# Patient Record
Sex: Female | Born: 1965 | Race: White | Hispanic: No | Marital: Married | Smoking: Never smoker
Health system: Southern US, Community
[De-identification: ages and names within clinical notes are randomized; demographics above are authoritative.]

## PROBLEM LIST (undated history)

## (undated) DIAGNOSIS — M1990 Unspecified osteoarthritis, unspecified site: Secondary | ICD-10-CM

---

## 1997-12-10 ENCOUNTER — Other Ambulatory Visit: Admission: RE | Admit: 1997-12-10 | Discharge: 1997-12-10 | Payer: Self-pay | Admitting: Obstetrics and Gynecology

## 2000-05-30 ENCOUNTER — Encounter: Payer: Self-pay | Admitting: Family Medicine

## 2000-05-30 ENCOUNTER — Ambulatory Visit (HOSPITAL_COMMUNITY): Admission: RE | Admit: 2000-05-30 | Discharge: 2000-05-30 | Payer: Self-pay | Admitting: Family Medicine

## 2001-08-20 ENCOUNTER — Ambulatory Visit (HOSPITAL_COMMUNITY): Admission: RE | Admit: 2001-08-20 | Discharge: 2001-08-20 | Payer: Self-pay | Admitting: Family Medicine

## 2001-08-20 ENCOUNTER — Encounter: Payer: Self-pay | Admitting: Family Medicine

## 2004-05-28 ENCOUNTER — Emergency Department (HOSPITAL_COMMUNITY): Admission: EM | Admit: 2004-05-28 | Discharge: 2004-05-28 | Payer: Self-pay | Admitting: Emergency Medicine

## 2004-05-29 ENCOUNTER — Emergency Department (HOSPITAL_COMMUNITY): Admission: EM | Admit: 2004-05-29 | Discharge: 2004-05-29 | Payer: Self-pay | Admitting: Emergency Medicine

## 2004-06-04 ENCOUNTER — Ambulatory Visit (HOSPITAL_COMMUNITY): Admission: RE | Admit: 2004-06-04 | Discharge: 2004-06-04 | Payer: Self-pay | Admitting: Internal Medicine

## 2005-08-24 IMAGING — CR DG RIBS 2V*R*
2 series · 2 of 2 positions shown · non-contrast
Comparison: none

CLINICAL DATA: Fell [REDACTED] against concrete.  Pain.  
 RIGHT RIB VIEWS:
 There are displaced fractures of the lateral right sixth and seventh ribs.  There is an associated 5 to 10 percent right apical pneumothorax.  There are right basilar atelectatic changes.

[view not recorded (1 of 2)]
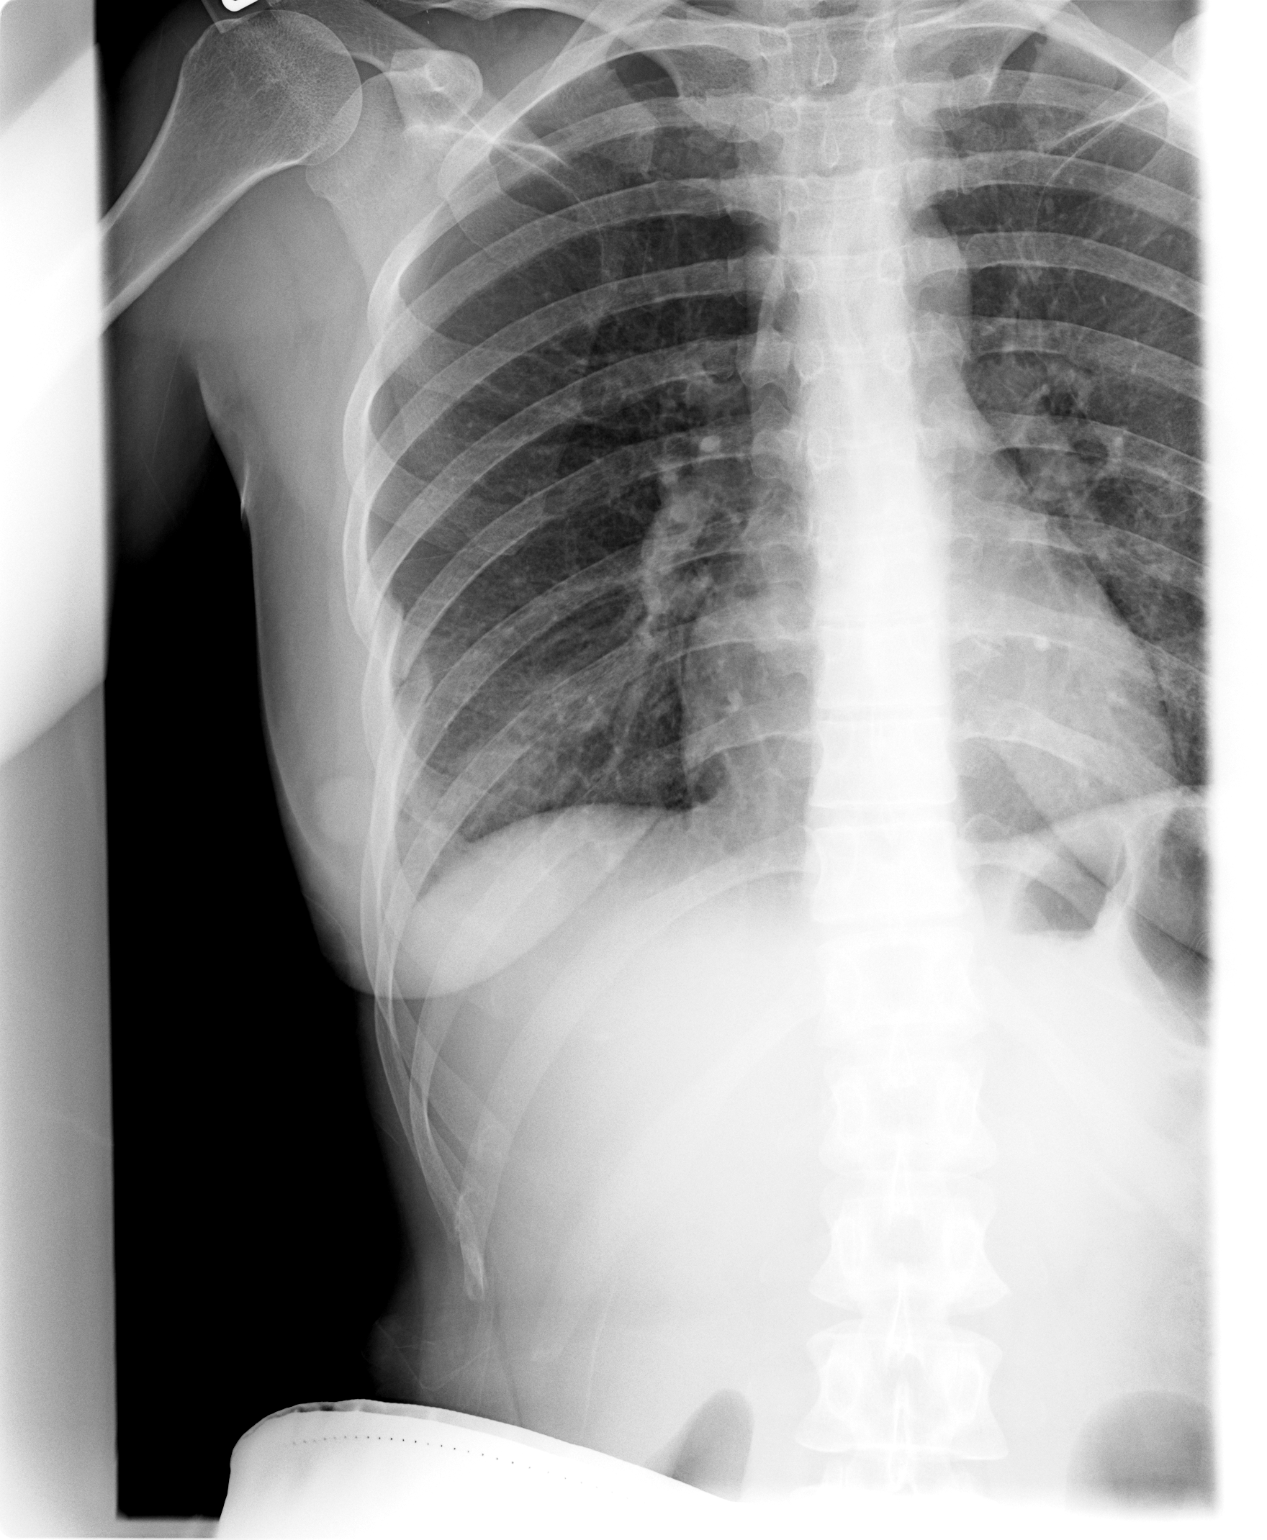

[view not recorded (2 of 2)]
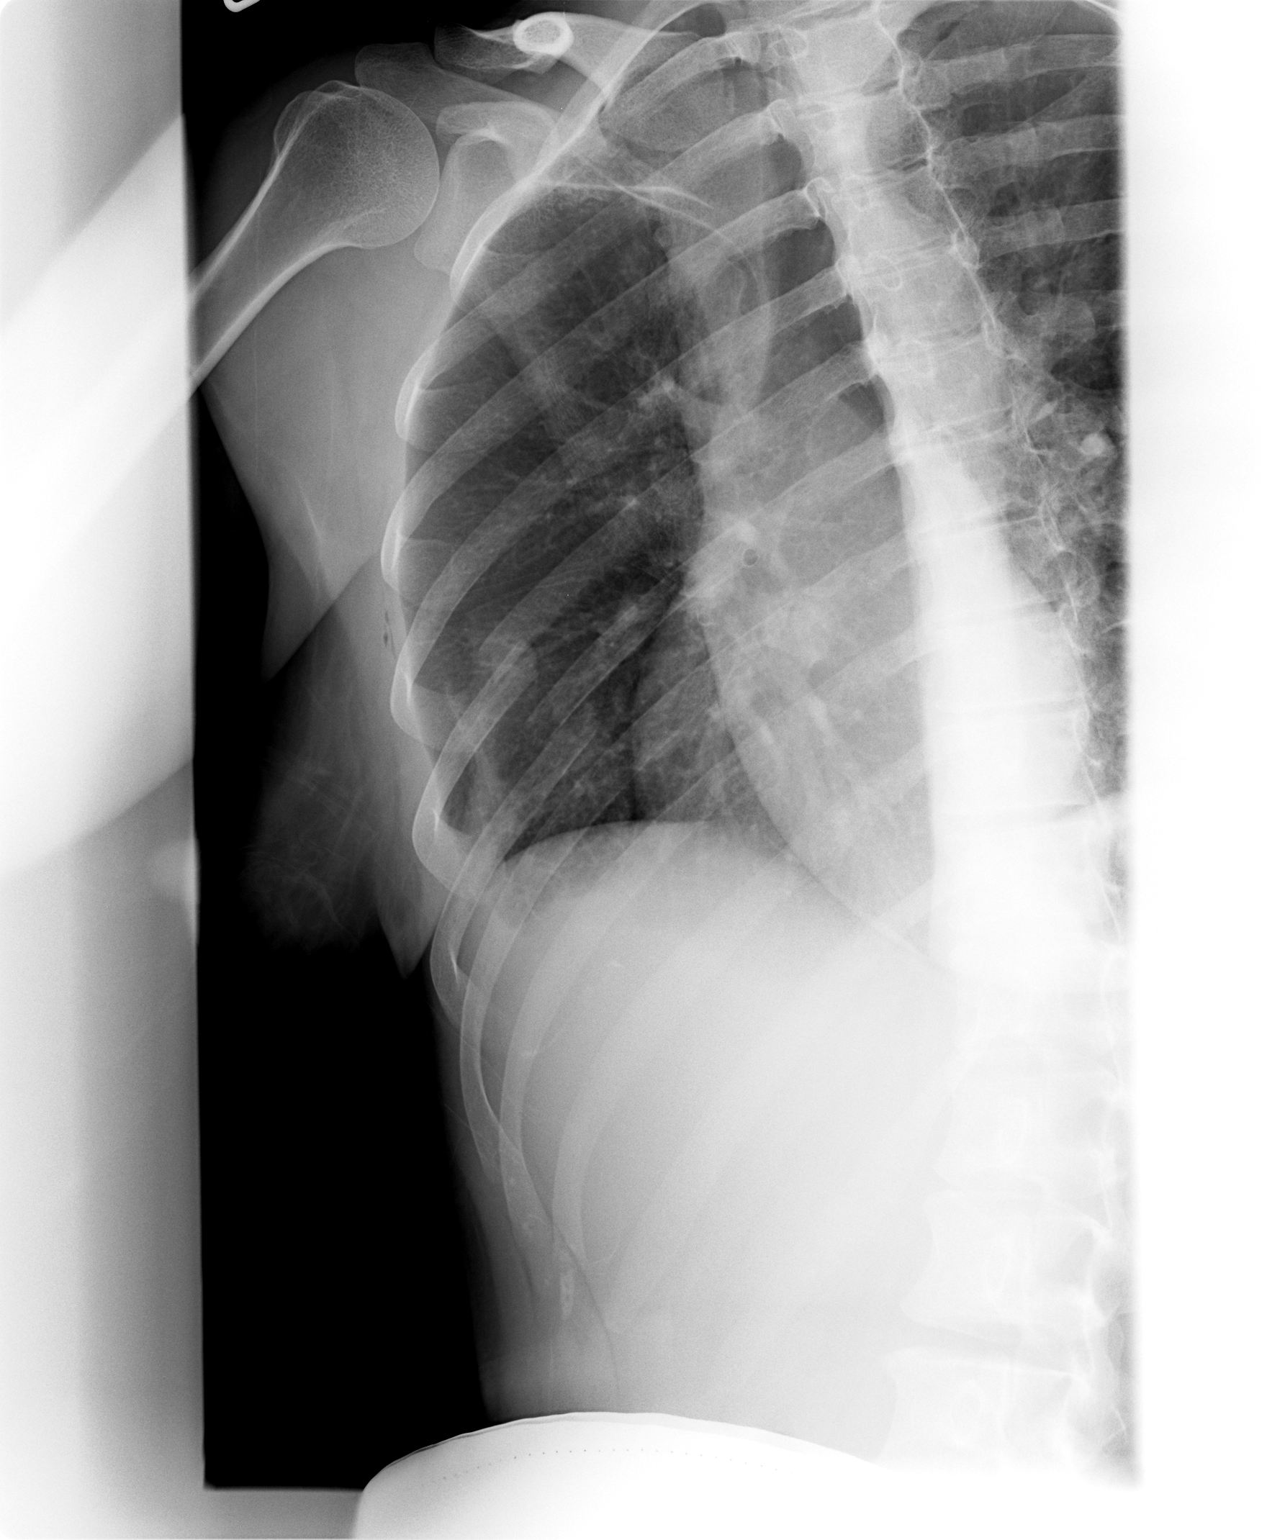

[2 of 2 positions shown; findings below may reference images not displayed]

IMPRESSION: Displaced fractures of the right sixth and seventh ribs laterally located.  There is a 5 to 10 percent right pneumothorax. 
 CHEST ? TWO VIEWS:
 There are displaced fractures of the right sixth and seventh ribs laterally located.  There is a 5 to 10 percent right pneumothorax and there are right basilar atelectatic changes.  The heart and mediastinal structures have a normal appearance and the left lung is clear.
IMPRESSION: Displaced right sixth and seventh rib fractures.  5 to 10 percent right pneumothorax and right basilar atelectasis noted.

## 2012-04-19 ENCOUNTER — Emergency Department (HOSPITAL_BASED_OUTPATIENT_CLINIC_OR_DEPARTMENT_OTHER)
Admission: EM | Admit: 2012-04-19 | Discharge: 2012-04-19 | Disposition: A | Discharge status: Home / Self Care | Payer: BC Managed Care – PPO | Attending: Emergency Medicine | Admitting: Emergency Medicine

## 2012-04-19 ENCOUNTER — Encounter (HOSPITAL_BASED_OUTPATIENT_CLINIC_OR_DEPARTMENT_OTHER): Payer: Self-pay | Admitting: Emergency Medicine

## 2012-04-19 ENCOUNTER — Emergency Department (HOSPITAL_BASED_OUTPATIENT_CLINIC_OR_DEPARTMENT_OTHER): Payer: BC Managed Care – PPO

## 2012-04-19 DIAGNOSIS — IMO0001 Reserved for inherently not codable concepts without codable children: Secondary | ICD-10-CM

## 2012-04-19 DIAGNOSIS — Z975 Presence of (intrauterine) contraceptive device: Secondary | ICD-10-CM | POA: Insufficient documentation

## 2012-04-19 DIAGNOSIS — N9489 Other specified conditions associated with female genital organs and menstrual cycle: Secondary | ICD-10-CM | POA: Insufficient documentation

## 2012-04-19 DIAGNOSIS — R102 Pelvic and perineal pain: Secondary | ICD-10-CM

## 2012-04-19 DIAGNOSIS — N939 Abnormal uterine and vaginal bleeding, unspecified: Secondary | ICD-10-CM

## 2012-04-19 HISTORY — DX: Unspecified osteoarthritis, unspecified site: M19.90

## 2012-04-19 LAB — URINALYSIS, ROUTINE W REFLEX MICROSCOPIC
Glucose, UA: NEGATIVE mg/dL
Ketones, ur: NEGATIVE mg/dL
Leukocytes, UA: NEGATIVE
Nitrite: NEGATIVE
Protein, ur: NEGATIVE mg/dL
Specific Gravity, Urine: 1.015 (ref 1.005–1.030)
Urobilinogen, UA: 1 mg/dL (ref 0.0–1.0)
pH: 5 (ref 5.0–8.0)

## 2012-04-19 LAB — URINE MICROSCOPIC-ADD ON

## 2012-04-19 LAB — PREGNANCY, URINE: Preg Test, Ur: NEGATIVE

## 2012-04-19 MED ORDER — ACETAMINOPHEN 325 MG PO TABS
650.0000 mg | ORAL_TABLET | Freq: Once | ORAL | Status: AC
  Administered 2012-04-19: 650 mg via ORAL
  Filled 2012-04-19: qty 2

## 2012-04-19 MED ORDER — ONDANSETRON 4 MG PO TBDP
4.0000 mg | ORAL_TABLET | Freq: Once | ORAL | Status: AC
  Administered 2012-04-19: 4 mg via ORAL
  Filled 2012-04-19: qty 1

## 2012-04-19 NOTE — ED Notes (Signed)
Py c/o of pelvic pain with cramping and bleeding. Pt had IUD placed in the beginning of September and is now having heavy bleeding and pain. She has states that the pain radiates to her back.

## 2012-04-19 NOTE — ED Provider Notes (Signed)
History     CSN: [id number]  Arrival date & time 04/19/12  4098   First MD Initiated Contact with Patient 04/19/12 [phone]      Chief Complaint  Patient presents with  . Pelvic Pain    (Consider location/radiation/quality/duration/timing/severity/associated sxs/prior treatment) HPI JUANETTE URIZAR is a 46 y.o. female presenting with pelvic cramping similar to menstrual cramps, had IUD (Mirena placed 3 weeks ago) by Dr. Chevis Pretty (OB/Gyn) intermittent pain, did get better initially, but woke up this morning with intermittently severe 8-9/10, cramping, vaginal bleeding.  No DC, no dysuria, no frequency. She says she's gone through about 1-2 pads every 8 hours. Denies any dizziness, lightheadedness, syncope, chest pain or shortness of breath  Past Medical History  Diagnosis Date  . Arthritis     History reviewed. No pertinent past surgical history.  No family history on file.  History  Substance Use Topics  . Smoking status: Never Smoker   . Smokeless tobacco: Not on file  . Alcohol Use: Yes     occasionally    OB History    Grav Para Term Preterm Abortions TAB SAB Ect Mult Living                  Review of Systems At least 10pt or greater review of systems completed and are negative except where specified in the HPI.  Allergies  Review of patient's allergies indicates no known allergies.  Home Medications   Current Outpatient Rx  Name Route Sig Dispense Refill  . CITALOPRAM HYDROBROMIDE 40 MG PO TABS Oral Take 40 mg by mouth daily.    Marland Kitchen FLUTICASONE PROPIONATE 50 MCG/ACT NA SUSP Nasal Place 2 sprays into the nose daily.    Marland Kitchen NAPROXEN SODIUM 550 MG PO TABS Oral Take 550 mg by mouth as needed.      BP 144/82  Pulse 84  Temp 98.1 F (36.7 C) (Oral)  Resp 18  Ht 5\' 2"  (1.575 m)  Wt 125 lb (56.7 kg)  BMI 22.86 kg/m2  SpO2 99%  Physical Exam  Nursing notes reviewed.  Electronic medical record reviewed. VITAL SIGNS:   Filed Vitals:   04/19/12 0659  BP: 144/82    Pulse: 84  Temp: 98.1 F (36.7 C)  TempSrc: Oral  Resp: 18  Height: 5\' 2"  (1.575 m)  Weight: 125 lb (56.7 kg)  SpO2: 99%   CONSTITUTIONAL: Awake, oriented, appears non-toxic HENT: Atraumatic, normocephalic, oral mucosa pink and moist, airway patent. Nares patent without drainage. External ears normal. EYES: Conjunctiva clear, EOMI, PERRLA NECK: Trachea midline, non-tender, supple CARDIOVASCULAR: Normal heart rate, Normal rhythm, No murmurs, rubs, gallops PULMONARY/CHEST: Clear to auscultation, no rhonchi, wheezes, or rales. Symmetrical breath sounds. Non-tender. ABDOMINAL: Non-distended, soft, non-tender - no rebound or guarding.  BS normal. NEUROLOGIC: Non-focal, moving all four extremities, no gross sensory or motor deficits. EXTREMITIES: No clubbing, cyanosis, or edema SKIN: Warm, Dry, No erythema, No rash PELVIC EXAM: normal external genitalia, vulva, vagina - some dark blood in the vault, cervix, uterus and adnexa.   ED Course  Procedures (including critical care time)  Labs Reviewed  URINALYSIS, ROUTINE W REFLEX MICROSCOPIC - Abnormal; Notable for the following:    Color, Urine AMBER (*)  BIOCHEMICALS MAY BE AFFECTED BY COLOR   Hgb urine dipstick TRACE (*)     Bilirubin Urine SMALL (*)     All other components within normal limits  URINE MICROSCOPIC-ADD ON - Abnormal; Notable for the following:    Casts HYALINE CASTS (*)  All other components within normal limits  PREGNANCY, URINE   No results found.  1. Pelvic pain in female   2. Vaginal bleeding   3. IUD     MDM  ZEANNA SUNDE is a 46 y.o. female presents complaining of pelvic pain and cramping and bleeding she does have a history was pertinent for an IUD placement about 3 weeks ago.  Pelvic exam shows the tail ends of the retrieval throat for the brain IUD are still in place, small amount of dark red blood in the vaginal vault. No cervical motion tenderness and no other vaginal discharge. Urinalysis is  unremarkable.  Patient's abdominal exam is unremarkable.  Chief concern was migration of the brain IUD into the myometrium or other parts of the pelvis. Ultrasound does show E. IUD is seen in the lower uterine segment however is only partially visualized best seen in the lower uterine segment.   I think that patient's presentation is most consistent with recent placement of a Mirena IUD and any adjustments or further modifications of the IUD can be made with the patient's OB/GYN doctor Mezer. I do not think this pain represented representative of PID, and it does not appear to be any abnormal intrauterine or extrauterine findings the adnexa either on physical exam or by ultrasound - making a neoplastic process much less likely. I do not think this patient has an intrapelvic emergency requiring emergent surgery at this time to Patient be treated conservatively with pain medicine and she is told to return for any increasing bleeding especially bright red, worsening pain, dizziness, lightheadedness or any other concerning symptoms.        Jones Skene, MD 04/21/12 1456

## 2013-07-15 IMAGING — US US PELVIS COMPLETE
1 series · 14 of 25 positions shown · non-contrast
Comparison: None.

CLINICAL DATA: Pain.  Recent placement of IUD.

TRANSABDOMINAL AND TRANSVAGINAL ULTRASOUND OF PELVIS
TECHNIQUE: Both transabdominal and transvaginal ultrasound
examinations of the pelvis were performed including evaluation of
the uterus, ovaries, adnexal regions, and pelvic cul-de-sac.

[Series 1: us pelvis complete · 0.28mm/px · 14 of 58 slices shown]
[im 1/58]
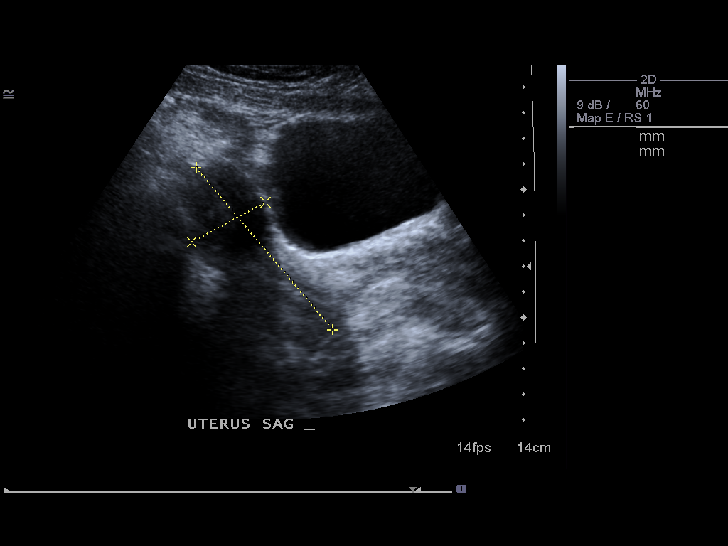
[im 5/58]
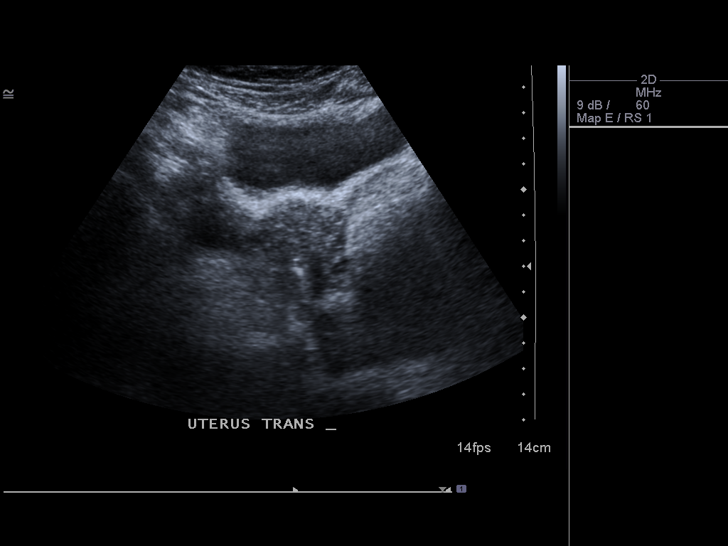
[im 10/58]
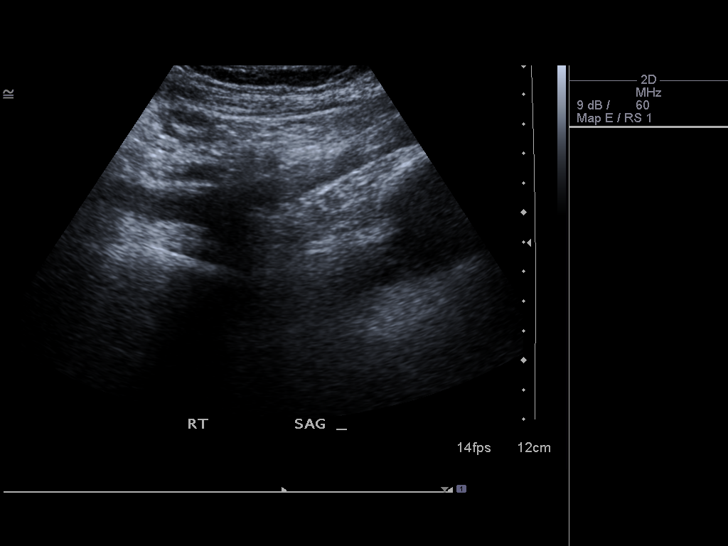
[im 15/58]
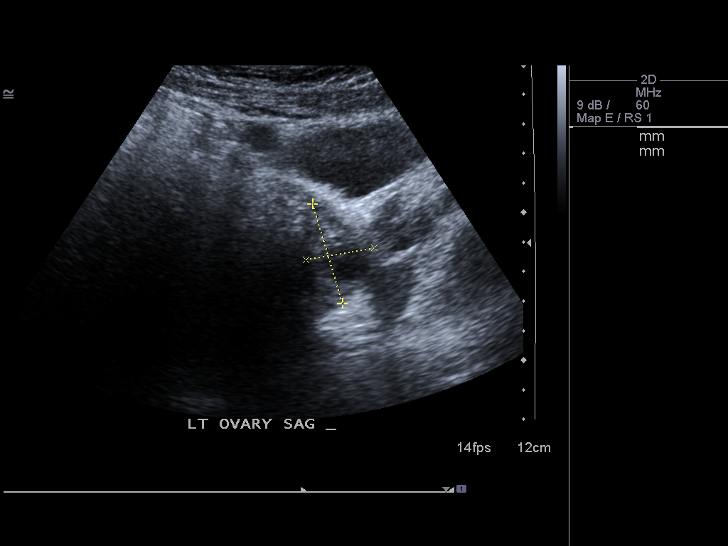
[im 20/58]
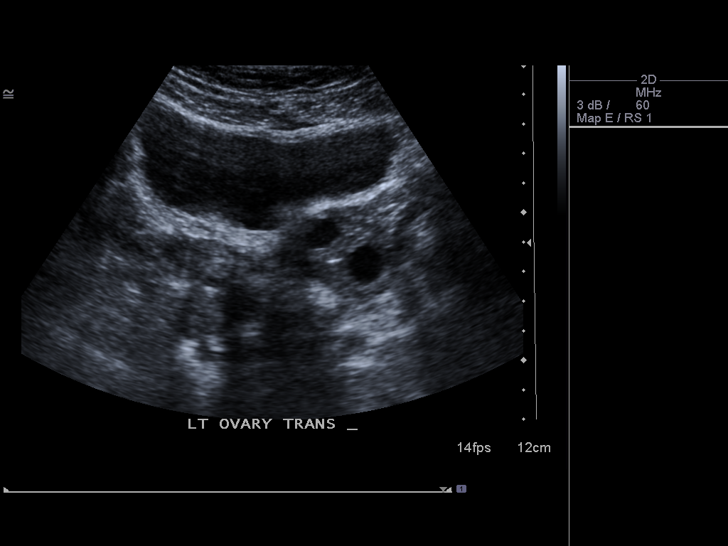
[im 22/58]
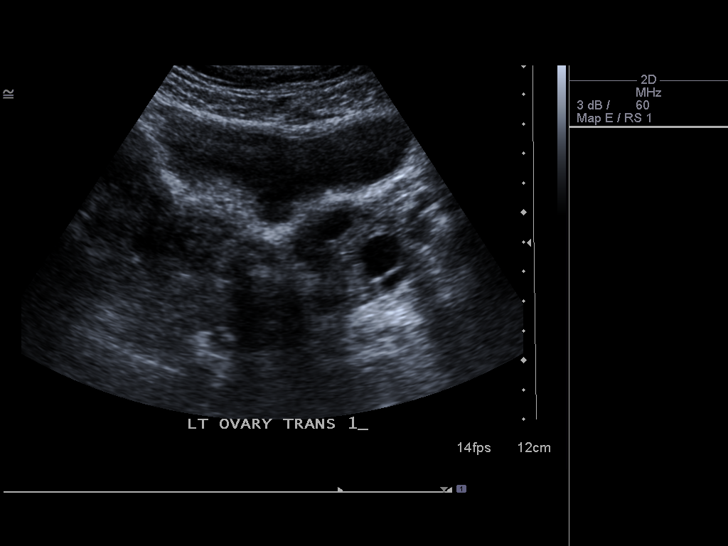
[im 27/58]
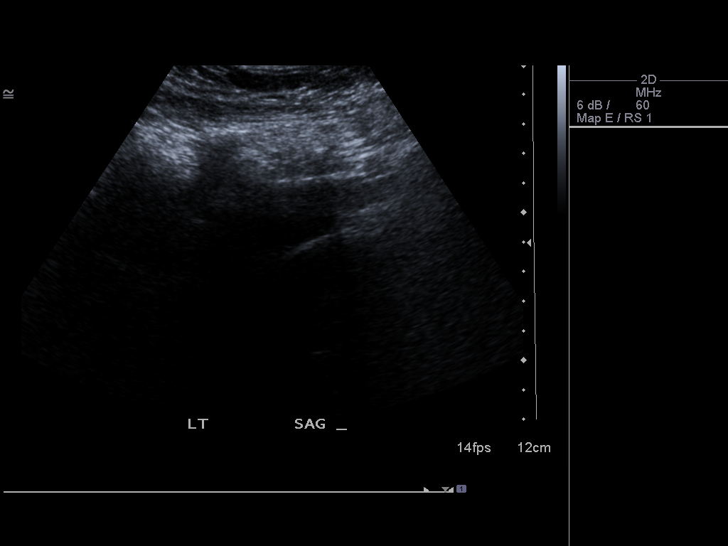
[im 31/58]
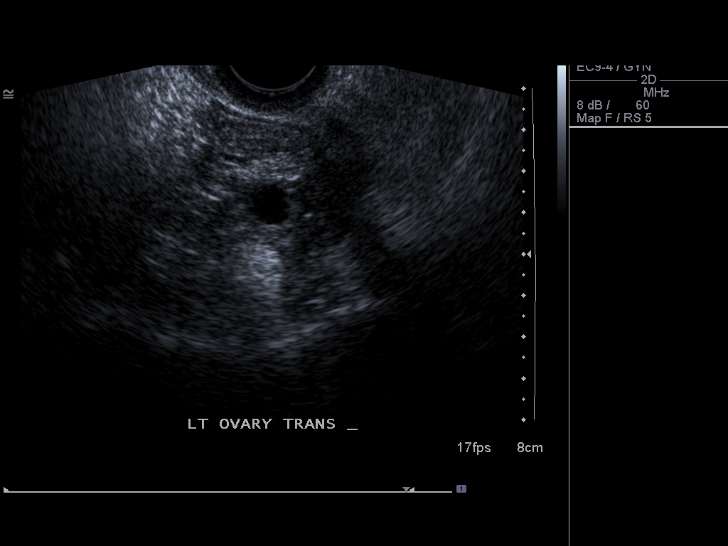
[im 36/58]
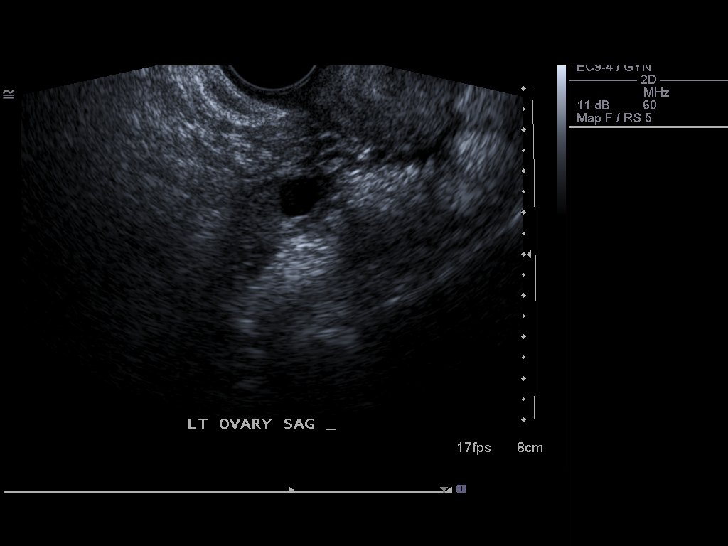
[im 39/58]
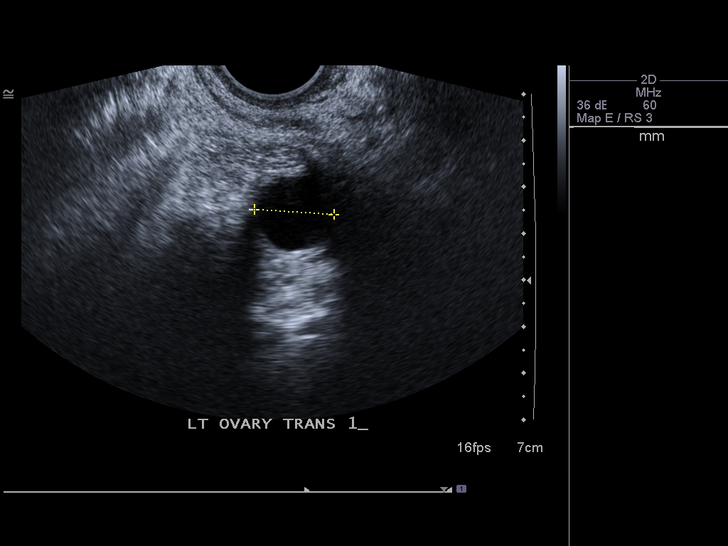
[im 43/58]
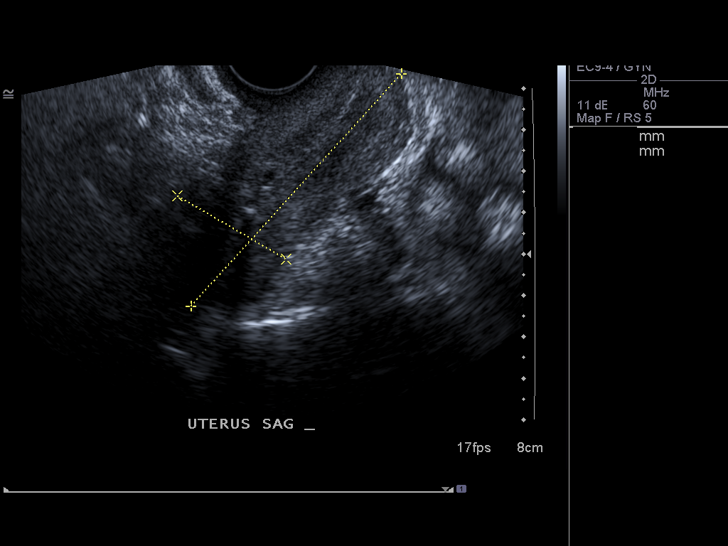
[im 48/58]
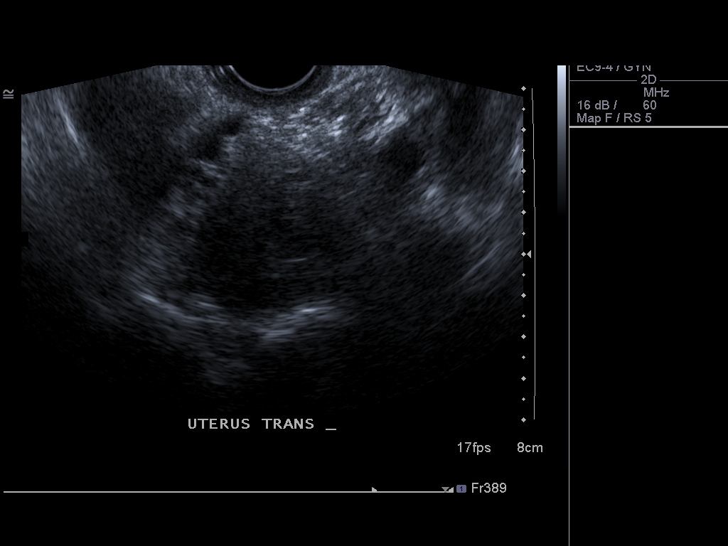
[im 53/58]
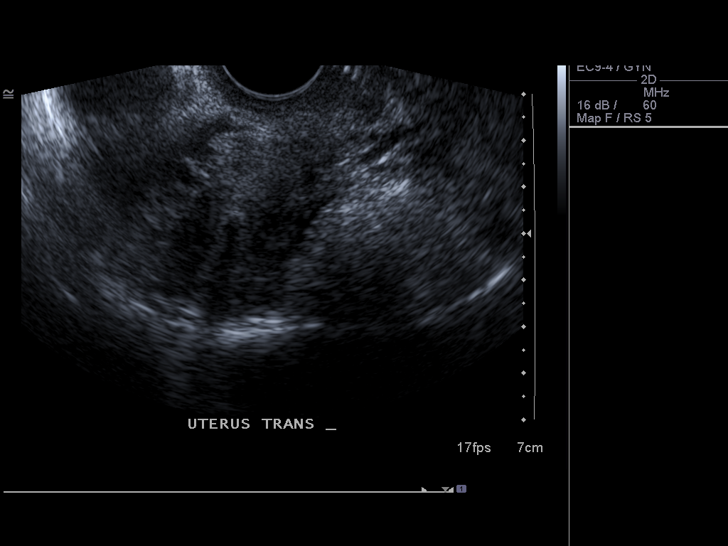
[im 58/58]
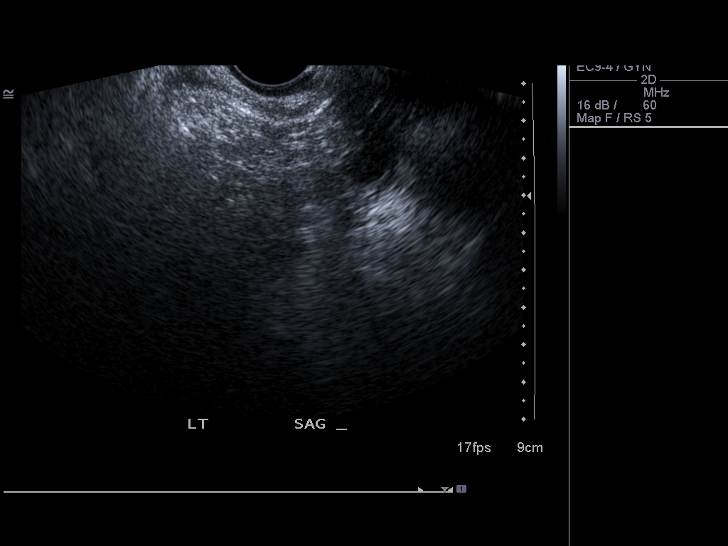

[14 of 25 positions shown; findings below may reference images not displayed]

FINDINGS: Uterus: 8.3 x 3.3 x 4.5 cm.  No focal abnormality.

Endometrium: 8 mm in thickness.  Portions of the IUD are noted,
best seen in the lower uterine segment region.

Right Ovary: Not visualized.  No adnexal masses seen.

Left Ovary: 3.9 x 2.4 x 3.1 cm.  1.8 cm follicle.  No adnexal
masses.

Other Findings:  No free fluid.
IMPRESSION: IUD partially visualized, best seen in the lower uterine segment
region.

Endometrium 8 mm in thickness.  IUD not definitively seen in the
endometrium within the fundus of the uterus.

## 2014-12-17 ENCOUNTER — Encounter (HOSPITAL_BASED_OUTPATIENT_CLINIC_OR_DEPARTMENT_OTHER): Payer: Self-pay | Admitting: *Deleted

## 2014-12-17 ENCOUNTER — Emergency Department (HOSPITAL_BASED_OUTPATIENT_CLINIC_OR_DEPARTMENT_OTHER): Payer: BLUE CROSS/BLUE SHIELD

## 2014-12-17 ENCOUNTER — Emergency Department (HOSPITAL_BASED_OUTPATIENT_CLINIC_OR_DEPARTMENT_OTHER)
Admission: EM | Admit: 2014-12-17 | Discharge: 2014-12-17 | Disposition: A | Discharge status: Home / Self Care | Payer: BLUE CROSS/BLUE SHIELD | Attending: Emergency Medicine | Admitting: Emergency Medicine

## 2014-12-17 DIAGNOSIS — Z566 Other physical and mental strain related to work: Secondary | ICD-10-CM

## 2014-12-17 DIAGNOSIS — Z79899 Other long term (current) drug therapy: Secondary | ICD-10-CM | POA: Diagnosis not present

## 2014-12-17 DIAGNOSIS — F419 Anxiety disorder, unspecified: Secondary | ICD-10-CM | POA: Insufficient documentation

## 2014-12-17 DIAGNOSIS — Z8739 Personal history of other diseases of the musculoskeletal system and connective tissue: Secondary | ICD-10-CM | POA: Diagnosis not present

## 2014-12-17 DIAGNOSIS — R0602 Shortness of breath: Secondary | ICD-10-CM | POA: Insufficient documentation

## 2014-12-17 DIAGNOSIS — Z7951 Long term (current) use of inhaled steroids: Secondary | ICD-10-CM | POA: Insufficient documentation

## 2014-12-17 DIAGNOSIS — R0789 Other chest pain: Secondary | ICD-10-CM

## 2014-12-17 DIAGNOSIS — Z7952 Long term (current) use of systemic steroids: Secondary | ICD-10-CM | POA: Insufficient documentation

## 2014-12-17 DIAGNOSIS — R079 Chest pain, unspecified: Secondary | ICD-10-CM | POA: Diagnosis present

## 2014-12-17 LAB — CBC
HCT: 40.2 % (ref 36.0–46.0)
Hemoglobin: 13.6 g/dL (ref 12.0–15.0)
MCH: 31.1 pg (ref 26.0–34.0)
MCHC: 33.8 g/dL (ref 30.0–36.0)
MCV: 91.8 fL (ref 78.0–100.0)
Platelets: 317 10*3/uL (ref 150–400)
RBC: 4.38 MIL/uL (ref 3.87–5.11)
RDW: 12.2 % (ref 11.5–15.5)
WBC: 8.7 10*3/uL (ref 4.0–10.5)

## 2014-12-17 LAB — TROPONIN I: Troponin I: <0.03 ng/mL (ref <0.031)

## 2014-12-17 LAB — BASIC METABOLIC PANEL
Anion gap: 9 (ref 5–15)
BUN: 19 mg/dL (ref 6–20)
CO2: 27 mmol/L (ref 22–32)
Calcium: 9.8 mg/dL (ref 8.9–10.3)
Chloride: 100 mmol/L — ABNORMAL LOW (ref 101–111)
Creatinine, Ser: 0.6 mg/dL (ref 0.44–1.00)
GFR calc Af Amer: >60 mL/min (ref >60)
GFR calc non Af Amer: >60 mL/min (ref >60)
Glucose, Bld: 121 mg/dL — ABNORMAL HIGH (ref 65–99)
Potassium: 3.9 mmol/L (ref 3.5–5.1)
Sodium: 136 mmol/L (ref 135–145)

## 2014-12-17 MED ORDER — ALPRAZOLAM 0.5 MG PO TABS: 0.5000 mg | ORAL_TABLET | Freq: Every evening | ORAL | Status: AC | PRN

## 2014-12-17 MED ORDER — LORAZEPAM 1 MG PO TABS
1.0000 mg | ORAL_TABLET | Freq: Once | ORAL | Status: AC
  Administered 2014-12-17: 1 mg via ORAL
  Filled 2014-12-17: qty 1

## 2014-12-17 NOTE — ED Notes (Addendum)
Pt sent here from [facility]D office for increased BP, CP x 2 days EKG in hand from office

## 2014-12-17 NOTE — Discharge Instructions (Signed)
Take xanax as directed for severe anxiety only. No driving or operating heavy machinery while taking xanax as it may cause drowsiness. Follow up with your primary care doctor within the next 5 days.  Chest Wall Pain Chest wall pain is pain in or around the bones and muscles of your chest. It may take up to 6 weeks to get better. It may take longer if you must stay physically active in your work and activities.  CAUSES  Chest wall pain may happen on its own. However, it may be caused by:  A viral illness like the flu.  Injury.  Coughing.  Exercise.  Arthritis.  Fibromyalgia.  Shingles. HOME CARE INSTRUCTIONS   Avoid overtiring physical activity. Try not to strain or perform activities that cause pain. This includes any activities using your chest or your abdominal and side muscles, especially if heavy weights are used.  Put ice on the sore area.  Put ice in a plastic bag.  Place a towel between your skin and the bag.  Leave the ice on for 15-20 minutes per hour while awake for the first 2 days.  Only take over-the-counter or prescription medicines for pain, discomfort, or fever as directed by your caregiver. SEEK IMMEDIATE MEDICAL CARE IF:   Your pain increases, or you are very uncomfortable.  You have a fever.  Your chest pain becomes worse.  You have new, unexplained symptoms.  You have nausea or vomiting.  You feel sweaty or lightheaded.  You have a cough with phlegm (sputum), or you cough up blood. MAKE SURE YOU:   Understand these instructions.  Will watch your condition.  Will get help right away if you are not doing well or get worse. Document Released: 07/11/2005 Document Revised: 10/03/2011 Document Reviewed: 03/07/2011 [facility] Patient Information 2015 [place], [place]. This information is not intended to replace advice given to you by your health care provider. Make sure you discuss any questions you have with your health care provider.  Stress and  Stress Management Stress is a normal reaction to life events. It is what you feel when life demands more than you are used to or more than you can handle. Some stress can be useful. For example, the stress reaction can help you catch the last bus of the day, study for a test, or meet a deadline at work. But stress that occurs too often or for too long can cause problems. It can affect your emotional health and interfere with relationships and normal daily activities. Too much stress can weaken your immune system and increase your risk for physical illness. If you already have a medical problem, stress can make it worse. CAUSES  All sorts of life events may cause stress. An event that causes stress for one person may not be stressful for another person. Major life events commonly cause stress. These may be positive or negative. Examples include losing your job, moving into a new home, getting married, having a baby, or losing a loved one. Less obvious life events may also cause stress, especially if they occur day after day or in combination. Examples include working long hours, driving in traffic, caring for children, being in debt, or being in a difficult relationship. SIGNS AND SYMPTOMS Stress may cause emotional symptoms including, the following:  Anxiety. This is feeling worried, afraid, on edge, overwhelmed, or out of control.  Anger. This is feeling irritated or impatient.  Depression. This is feeling sad, down, helpless, or guilty.  Difficulty focusing, remembering, or  making decisions. Stress may cause physical symptoms, including the following:   Aches and pains. These may affect your head, neck, back, stomach, or other areas of your body.  Tight muscles or clenched jaw.  Low energy or trouble sleeping. Stress may cause unhealthy behaviors, including the following:   Eating to feel better (overeating) or skipping meals.  Sleeping too little, too much, or both.  Working too much  or putting off tasks (procrastination).  Smoking, drinking alcohol, or using drugs to feel better. DIAGNOSIS  Stress is diagnosed through an assessment by your health care provider. Your health care provider will ask questions about your symptoms and any stressful life events.Your health care provider will also ask about your medical history and may order blood tests or other tests. Certain medical conditions and medicine can cause physical symptoms similar to stress. Mental illness can cause emotional symptoms and unhealthy behaviors similar to stress. Your health care provider may refer you to a mental health professional for further evaluation.  TREATMENT  Stress management is the recommended treatment for stress.The goals of stress management are reducing stressful life events and coping with stress in healthy ways.  Techniques for reducing stressful life events include the following:  Stress identification. Self-monitor for stress and identify what causes stress for you. These skills may help you to avoid some stressful events.  Time management. Set your priorities, keep a calendar of events, and learn to say "no." These tools can help you avoid making too many commitments. Techniques for coping with stress include the following:  Rethinking the problem. Try to think realistically about stressful events rather than ignoring them or overreacting. Try to find the positives in a stressful situation rather than focusing on the negatives.  Exercise. Physical exercise can release both physical and emotional tension. The key is to find a form of exercise you enjoy and do it regularly.  Relaxation techniques. These relax the body and mind. Examples include yoga, meditation, tai chi, biofeedback, deep breathing, progressive muscle relaxation, listening to music, being out in nature, journaling, and other hobbies. Again, the key is to find one or more that you enjoy and can do regularly.  Healthy  lifestyle. Eat a balanced diet, get plenty of sleep, and do not smoke. Avoid using alcohol or drugs to relax.  Strong support network. Spend time with family, friends, or other people you enjoy being around.Express your feelings and talk things over with someone you trust. Counseling or talktherapy with a mental health professional may be helpful if you are having difficulty managing stress on your own. Medicine is typically not recommended for the treatment of stress.Talk to your health care provider if you think you need medicine for symptoms of stress. HOME CARE INSTRUCTIONS  Keep all follow-up visits as directed by your health care provider.  Take all medicines as directed by your health care provider. SEEK MEDICAL CARE IF:  Your symptoms get worse or you start having new symptoms.  You feel overwhelmed by your problems and can no longer manage them on your own. SEEK IMMEDIATE MEDICAL CARE IF:  You feel like hurting yourself or someone else. Document Released: 01/04/2001 Document Revised: 11/25/2013 Document Reviewed: 03/05/2013 [facility] Patient Information 2015 [place], [place]. This information is not intended to replace advice given to you by your health care provider. Make sure you discuss any questions you have with your health care provider.  Chest Pain (Nonspecific) It is often hard to give a specific diagnosis for the cause of chest  pain. There is always a chance that your pain could be related to something serious, such as a heart attack or a blood clot in the lungs. You need to follow up with your health care provider for further evaluation. CAUSES   Heartburn.  Pneumonia or bronchitis.  Anxiety or stress.  Inflammation around your heart (pericarditis) or lung (pleuritis or pleurisy).  A blood clot in the lung.  A collapsed lung (pneumothorax). It can develop suddenly on its own (spontaneous pneumothorax) or from trauma to the chest.  Shingles infection (herpes  zoster virus). The chest wall is composed of bones, muscles, and cartilage. Any of these can be the source of the pain.  The bones can be bruised by injury.  The muscles or cartilage can be strained by coughing or overwork.  The cartilage can be affected by inflammation and become sore (costochondritis). DIAGNOSIS  Lab tests or other studies may be needed to find the cause of your pain. Your health care provider may have you take a test called an ambulatory electrocardiogram (ECG). An ECG records your heartbeat patterns over a 24-hour period. You may also have other tests, such as:  Transthoracic echocardiogram (TTE). During echocardiography, sound waves are used to evaluate how blood flows through your heart.  Transesophageal echocardiogram (TEE).  Cardiac monitoring. This allows your health care provider to monitor your heart rate and rhythm in real time.  Holter monitor. This is a portable device that records your heartbeat and can help diagnose heart arrhythmias. It allows your health care provider to track your heart activity for several days, if needed.  Stress tests by exercise or by giving medicine that makes the heart beat faster. TREATMENT   Treatment depends on what may be causing your chest pain. Treatment may include:  Acid blockers for heartburn.  Anti-inflammatory medicine.  Pain medicine for inflammatory conditions.  Antibiotics if an infection is present.  You may be advised to change lifestyle habits. This includes stopping smoking and avoiding alcohol, caffeine, and chocolate.  You may be advised to keep your head raised (elevated) when sleeping. This reduces the chance of acid going backward from your stomach into your esophagus. Most of the time, nonspecific chest pain will improve within 2-3 days with rest and mild pain medicine.  HOME CARE INSTRUCTIONS   If antibiotics were prescribed, take them as directed. Finish them even if you start to feel  better.  For the next few days, avoid physical activities that bring on chest pain. Continue physical activities as directed.  Do not use any tobacco products, including cigarettes, chewing tobacco, or electronic cigarettes.  Avoid drinking alcohol.  Only take medicine as directed by your health care provider.  Follow your health care provider's suggestions for further testing if your chest pain does not go away.  Keep any follow-up appointments you made. If you do not go to an appointment, you could develop lasting (chronic) problems with pain. If there is any problem keeping an appointment, call to reschedule. SEEK MEDICAL CARE IF:   Your chest pain does not go away, even after treatment.  You have a rash with blisters on your chest.  You have a fever. SEEK IMMEDIATE MEDICAL CARE IF:   You have increased chest pain or pain that spreads to your arm, neck, jaw, back, or abdomen.  You have shortness of breath.  You have an increasing cough, or you cough up blood.  You have severe back or abdominal pain.  You feel nauseous or vomit.  You have severe weakness.  You faint.  You have chills. This is an emergency. Do not wait to see if the pain will go away. Get medical help at once. Call your local emergency services (911 in [place] Do not drive yourself to the hospital. MAKE SURE YOU:   Understand these instructions.  Will watch your condition.  Will get help right away if you are not doing well or get worse. Document Released: 04/20/2005 Document Revised: 07/16/2013 Document Reviewed: 02/14/2008 [facility] Patient Information 2015 [place], [place]. This information is not intended to replace advice given to you by your health care provider. Make sure you discuss any questions you have with your health care provider.

## 2014-12-17 NOTE — ED Notes (Signed)
Patient transported to X-ray 

## 2014-12-17 NOTE — ED Provider Notes (Signed)
CSN: [id number]     Arrival date & time 12/17/14  1557 History   First MD Initiated Contact with Patient 12/17/14 1559     Chief Complaint  Patient presents with  . Chest Pain     (Consider location/radiation/quality/duration/timing/severity/associated sxs/prior Treatment) HPI Comments: 49 year old female visiting from PCPs office with concerns of increased blood pressure. She was there for evaluation of her blood pressure as she was told by the orthopedist that her blood pressure was elevated. Reports a blood pressure of 148/89. PCP office didn't EKG, and advised her to go to the emergency department as they told pt they "could not start blood pressure medication without an emergency department evaluation". Patient reports she's been experiencing midsternal to left-sided chest pain over the past 2 days, worsened by pressing on her chest. Endorses significant amount of increased stress and anxiety recently due to work. She does some heavy lifting at work as well. When she feels very stressed and anxious, she occasionally gets short of breath. Currently no shortness of breath. Denies headache, vision change, dizziness, lightheadedness, nausea or vomiting. No personal or family history of early heart disease. Mom has a history of high blood pressure.  Patient is a 49 y.o. female presenting with chest pain. The history is provided by the patient.  Chest Pain Associated symptoms: shortness of breath     Past Medical History  Diagnosis Date  . Arthritis    History reviewed. No pertinent past surgical history. History reviewed. No pertinent family history. History  Substance Use Topics  . Smoking status: Never Smoker   . Smokeless tobacco: Not on file  . Alcohol Use: Yes     Comment: occasionally   OB History    No data available     Review of Systems  Respiratory: Positive for shortness of breath.   Cardiovascular: Positive for chest pain.  Psychiatric/Behavioral: The patient is  nervous/anxious.   All other systems reviewed and are negative.     Allergies  Review of patient's allergies indicates no known allergies.  Home Medications   Prior to Admission medications   Medication Sig Start Date End Date Taking? Authorizing Provider  predniSONE (DELTASONE) 10 MG tablet Take 10 mg by mouth daily with breakfast.   Yes Historical Provider, MD  ALPRAZolam (XANAX) 0.5 MG tablet Take 1 tablet (0.5 mg total) by mouth at bedtime as needed for anxiety. 12/17/14   Kathrynn Speedobyn M Louise Victory, PA-C  citalopram (CELEXA) 40 MG tablet Take 40 mg by mouth daily.    Historical Provider, MD  fluticasone (FLONASE) 50 MCG/ACT nasal spray Place 2 sprays into the nose daily.    Historical Provider, MD  naproxen sodium (ANAPROX) 550 MG tablet Take 550 mg by mouth as needed.    Historical Provider, MD   BP 191/85 mmHg  Pulse 76  Temp(Src) 99.1 F (37.3 C) (Oral)  Resp 16  Ht 5\' 2"  (1.575 m)  Wt 145 lb (65.772 kg)  BMI 26.51 kg/m2  SpO2 100% Physical Exam  Constitutional: She is oriented to person, place, and time. She appears well-developed and well-nourished. No distress.  HENT:  Head: Normocephalic and atraumatic.  Mouth/Throat: Oropharynx is clear and moist.  Eyes: Conjunctivae and EOM are normal. Pupils are equal, round, and reactive to light.  Neck: Normal range of motion. Neck supple. No JVD present.  Cardiovascular: Normal rate, regular rhythm, normal heart sounds and intact distal pulses.   No extremity edema.  Pulmonary/Chest: Effort normal and breath sounds normal. No respiratory distress. She  exhibits tenderness.    Abdominal: Soft. Bowel sounds are normal. There is no tenderness.  Musculoskeletal: Normal range of motion. She exhibits no edema.  Neurological: She is alert and oriented to person, place, and time. She has normal strength. No sensory deficit.  Speech fluent, goal oriented. Moves limbs without ataxia. Equal grip strength bilateral.  Skin: Skin is warm and dry.  She is not diaphoretic.  Psychiatric: She has a normal mood and affect. Her behavior is normal.  Nursing note and vitals reviewed.   ED Course  Procedures (including critical care time) Labs Review Labs Reviewed  BASIC METABOLIC PANEL - Abnormal; Notable for the following:    Chloride 100 (*)    Glucose, Bld 121 (*)    All other components within normal limits  CBC  TROPONIN I    Imaging Review Dg Chest 2 View  12/17/2014   CLINICAL DATA:  Chest pain  EXAM: CHEST  2 VIEW  COMPARISON:  10/14/2013  FINDINGS: The heart size and mediastinal contours are within normal limits. Both lungs are clear. The visualized skeletal structures are unremarkable with the exception of healed bilateral posterior rib fractures.  IMPRESSION: No active cardiopulmonary disease.   Electronically Signed   By: Christiana Pellant M.D.   On: 12/17/2014 16:43     EKG Interpretation   Date/Time:  Wednesday Dec 17 2014 16:29:29 EDT Ventricular Rate:  82 PR Interval:  148 QRS Duration: 84 QT Interval:  404 QTC Calculation: 472 R Axis:   50 Text Interpretation:  Normal sinus rhythm Nonspecific ST abnormality  Abnormal ECG Confirmed by Bebe Shaggy  MD, Dorinda Hill ([id number]) on 12/17/2014  4:33:44 PM      MDM   Final diagnoses:  Chest wall pain  Stress at work   Nontoxic appearing, NAD. AF VSS. Chest pain is reproducible. She is also under increased stress and anxiety from work. Symptoms completely resolved after receiving Ativan in the ED although the chest pain is still reproducible. Doubt cardiac. Heart score 2. Perc negative. Doubt PE. Will discharge home with 8 tablets of 0.5 mg Xanax for severe anxiety, out of work note for the next few days. She will follow-up with her PCP on Monday or Tuesday, 5-6 days from now. Stable for discharge. Return precautions given. Patient states understanding of treatment care plan and is agreeable.  Kathrynn Speed, PA-C 12/17/14 1744  Zadie Rhine, MD 12/18/14 [phone]

## 2014-12-17 NOTE — ED Notes (Signed)
MD at bedside. 

## 2023-04-28 ENCOUNTER — Encounter (HOSPITAL_BASED_OUTPATIENT_CLINIC_OR_DEPARTMENT_OTHER): Payer: Self-pay | Admitting: Pediatrics

## 2023-04-28 ENCOUNTER — Other Ambulatory Visit: Payer: Self-pay

## 2023-04-28 ENCOUNTER — Emergency Department (HOSPITAL_BASED_OUTPATIENT_CLINIC_OR_DEPARTMENT_OTHER)
Admission: EM | Admit: 2023-04-28 | Discharge: 2023-04-28 | Disposition: A | Discharge status: Home / Self Care | Payer: 59 | Attending: Emergency Medicine | Admitting: Emergency Medicine

## 2023-04-28 ENCOUNTER — Other Ambulatory Visit (HOSPITAL_BASED_OUTPATIENT_CLINIC_OR_DEPARTMENT_OTHER): Payer: Self-pay

## 2023-04-28 DIAGNOSIS — I10 Essential (primary) hypertension: Secondary | ICD-10-CM | POA: Diagnosis not present

## 2023-04-28 DIAGNOSIS — Z760 Encounter for issue of repeat prescription: Secondary | ICD-10-CM | POA: Insufficient documentation

## 2023-04-28 DIAGNOSIS — Z79899 Other long term (current) drug therapy: Secondary | ICD-10-CM | POA: Diagnosis not present

## 2023-04-28 DIAGNOSIS — F419 Anxiety disorder, unspecified: Secondary | ICD-10-CM | POA: Diagnosis present

## 2023-04-28 MED ORDER — METOPROLOL SUCCINATE ER 25 MG PO TB24
50.0000 mg | ORAL_TABLET | Freq: Every day | ORAL | 0 refills | Status: AC
  Filled 2023-04-28: qty 30, 15d supply, fill #0

## 2023-04-28 MED ORDER — HYDROXYZINE HCL 25 MG PO TABS
25.0000 mg | ORAL_TABLET | Freq: Three times a day (TID) | ORAL | 0 refills | Status: AC | PRN
  Filled 2023-04-28: qty 12, 4d supply, fill #0

## 2023-04-28 MED ORDER — LISINOPRIL 10 MG PO TABS
10.0000 mg | ORAL_TABLET | Freq: Every day | ORAL | 0 refills | Status: AC
  Filled 2023-04-28: qty 30, 30d supply, fill #0

## 2023-04-28 NOTE — ED Triage Notes (Signed)
Reports she went to [facility] due to high stress level as she started a new job, and blood pressure was high. Stated she does have hx but has been on BP meds on and off x 4 months. States she was on Lisinopril and metoprolol. Denies pain when asked, but is needing something for stress.

## 2023-04-28 NOTE — ED Provider Notes (Signed)
[place] [facility] Provider Note   CSN: [id number] Arrival date & time: 04/28/23  6213     History  Chief Complaint  Patient presents with   Hypertension   Medication Refill    Anna Aguilar is a 57 y.o. female.   Hypertension  Medication Refill Patient presents with hypertension.  History of same but has been off her medicines for a couple months.  States she cannot go see the doctor due to money issues.  Recently started new job but has had more stress.  Now blood pressure has been high.  Also has had worsening anxiety.  No suicidal or homicidal thoughts.  No chest pain.  No trouble breathing.  No swelling.  Was on lisinopril and metoprolol.    Past Medical History:  Diagnosis Date   Arthritis   Hypertension  Home Medications Prior to Admission medications   Medication Sig Start Date End Date Taking? Authorizing Provider  hydrOXYzine (ATARAX) 25 MG tablet Take 1 tablet (25 mg total) by mouth every 8 (eight) hours as needed for anxiety. 04/28/23  Yes Benjiman Core, MD  lisinopril (ZESTRIL) 10 MG tablet Take 1 tablet (10 mg total) by mouth daily. 04/28/23  Yes Benjiman Core, MD  metoprolol succinate (TOPROL-XL) 25 MG 24 hr tablet Take 2 tablets (50 mg total) by mouth daily. 04/28/23  Yes Benjiman Core, MD  ALPRAZolam Prudy Feeler) 0.5 MG tablet Take 1 tablet (0.5 mg total) by mouth at bedtime as needed for anxiety. 12/17/14   Hess, Nada Boozer, PA-C  citalopram (CELEXA) 40 MG tablet Take 40 mg by mouth daily.    [provider]  fluticasone (FLONASE) 50 MCG/ACT nasal spray Place 2 sprays into the nose daily.    [provider]  naproxen sodium (ANAPROX) 550 MG tablet Take 550 mg by mouth as needed.    [provider]  predniSONE (DELTASONE) 10 MG tablet Take 10 mg by mouth daily with breakfast.    [provider]      Allergies    Patient has no known allergies.    Review of Systems   Review of  Systems  Physical Exam Updated Vital Signs BP (!) 205/94 (BP Location: Left Arm)   Pulse (!) 102   Temp 98 F (36.7 C) (Oral)   Resp 17   Ht 5\' 2"  (1.575 m)   Wt 68 kg   SpO2 94%   BMI 27.44 kg/m  Physical Exam Vitals and nursing note reviewed.  Cardiovascular:     Rate and Rhythm: Regular rhythm.  Pulmonary:     Breath sounds: No wheezing or rhonchi.  Abdominal:     Tenderness: There is no abdominal tenderness.  Musculoskeletal:        General: No tenderness.  Neurological:     Mental Status: She is alert.     ED Results / Procedures / Treatments   Labs (all labs ordered are listed, but only abnormal results are displayed) Labs Reviewed - No data to display  EKG None  Radiology No results found.  Procedures Procedures    Medications Ordered in ED Medications - No data to display  ED Course/ Medical Decision Making/ A&P                                 Medical Decision Making Risk Prescription drug management.   Patient with hypertension.  History of same but noncompliant with  medications.  Is somewhat hypertensive but doubt endorgan damage.  Asymptomatic.  Will start previous home medicines and will follow-up with her PCP.  Also anxiety.  Has had some related to work.  Not suicidal or homicidal.  I think potentially benefit from behavioral health follow-up.  Behavioral health urgent care information given.  Will discharge home.        Final Clinical Impression(s) / ED Diagnoses Final diagnoses:  Hypertension, unspecified type  Anxiety    Rx / DC Orders ED Discharge Orders          Ordered    lisinopril (ZESTRIL) 10 MG tablet  Daily        04/28/23 1004    metoprolol succinate (TOPROL-XL) 25 MG 24 hr tablet  Daily        04/28/23 1004    hydrOXYzine (ATARAX) 25 MG tablet  Every 8 hours PRN        04/28/23 1004              Benjiman Core, MD 04/28/23 1007

## 2023-04-28 NOTE — ED Notes (Signed)
Urine on hold in lab

## 2023-09-03 ENCOUNTER — Emergency Department (HOSPITAL_BASED_OUTPATIENT_CLINIC_OR_DEPARTMENT_OTHER)
Admission: EM | Admit: 2023-09-03 | Discharge: 2023-09-03 | Disposition: A | Discharge status: Home / Self Care | Payer: Self-pay | Attending: Emergency Medicine | Admitting: Emergency Medicine

## 2023-09-03 ENCOUNTER — Emergency Department (HOSPITAL_BASED_OUTPATIENT_CLINIC_OR_DEPARTMENT_OTHER): Payer: Self-pay

## 2023-09-03 ENCOUNTER — Other Ambulatory Visit: Payer: Self-pay

## 2023-09-03 ENCOUNTER — Encounter (HOSPITAL_BASED_OUTPATIENT_CLINIC_OR_DEPARTMENT_OTHER): Payer: Self-pay | Admitting: Emergency Medicine

## 2023-09-03 DIAGNOSIS — M549 Dorsalgia, unspecified: Secondary | ICD-10-CM | POA: Insufficient documentation

## 2023-09-03 DIAGNOSIS — Z20822 Contact with and (suspected) exposure to covid-19: Secondary | ICD-10-CM | POA: Insufficient documentation

## 2023-09-03 LAB — RESP PANEL BY RT-PCR (RSV, FLU A&B, COVID)  RVPGX2
Influenza A by PCR: NEGATIVE
Influenza B by PCR: NEGATIVE
Resp Syncytial Virus by PCR: NEGATIVE
SARS Coronavirus 2 by RT PCR: NEGATIVE

## 2023-09-03 MED ORDER — KETOROLAC TROMETHAMINE 15 MG/ML IJ SOLN
15.0000 mg | Freq: Once | INTRAMUSCULAR | Status: AC
  Administered 2023-09-03: 15 mg via INTRAMUSCULAR
  Filled 2023-09-03: qty 1

## 2023-09-03 NOTE — ED Provider Notes (Signed)
 [place] [facility] Provider Note   CSN: [id number] Arrival date & time: 09/03/23  1628     History  Chief Complaint  Patient presents with   Shortness of Breath   Back Pain    Anna Aguilar is a 58 y.o. female.  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  Patient reports that over the past month she has had back pain.  Describes it as behind her left scapula.  Worsened while she is at work and under stress.  Says that it is a muscle spasming sensation.  Worse with movement.  Does not recall a specific injury that started.  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  Says that occasionally she feels short of breath because of it.  Has been taking tramadol, Flexeril, lidocaine patches, Tylenol  and ibuprofen for this.  However, has not taken them all at the same time.  Was at work today and decided to come in for evaluation.       Home Medications Prior to Admission medications   Medication Sig Start Date End Date Taking? Authorizing Provider  ALPRAZolam  (XANAX ) 0.5 MG tablet Take 1 tablet (0.5 mg total) by mouth at bedtime as needed for anxiety. 12/17/14   Hess, Catheryn CHRISTELLA, PA-C  citalopram (CELEXA) 40 MG tablet Take 40 mg by mouth daily.    [provider]  fluticasone (FLONASE) 50 MCG/ACT nasal spray Place 2 sprays into the nose daily.    [provider]  hydrOXYzine  (ATARAX ) 25 MG tablet Take 1 tablet (25 mg total) by mouth every 8 (eight) hours as needed for anxiety. 04/28/23   Patsey Lot, MD  lisinopril  (ZESTRIL ) 10 MG tablet Take 1 tablet (10 mg total) by mouth daily. 04/28/23   Patsey Lot, MD  metoprolol  succinate (TOPROL -XL) 25 MG 24 hr tablet Take 2 tablets (50 mg total) by mouth daily. 04/28/23   Patsey Lot, MD  naproxen sodium (ANAPROX) 550 MG tablet Take 550 mg by mouth as needed.    [provider]  predniSONE  (DELTASONE) 10 MG tablet Take 10 mg by mouth daily with breakfast.    [provider]      Allergies    Patient has no known allergies.    Review of Systems   Review of Systems  Physical Exam Updated Vital Signs BP (!) 153/85   Pulse 73   Temp 97.9 F (36.6 C) (Oral)   Resp 20   Ht 5' 2 (1.575 m)   Wt 68 kg   SpO2 100%   BMI 27.44 kg/m  Physical Exam Vitals and nursing note reviewed.  Constitutional:      General: She is not in acute distress.    Appearance: She is well-developed.  HENT:     Head: Normocephalic and atraumatic.     Right Ear: External ear normal.     Left Ear: External ear normal.     Nose: Nose normal.  Eyes:     Extraocular Movements: Extraocular movements intact.     Conjunctiva/sclera: Conjunctivae normal.     Pupils: Pupils are equal, round, and reactive to light.  Cardiovascular:     Rate and Rhythm: Normal rate and regular rhythm.     Heart sounds: No murmur heard. Pulmonary:     Effort: Pulmonary effort is normal. No respiratory distress.     Breath sounds: Normal breath sounds.  Musculoskeletal:     Cervical  back: Normal range of motion and neck supple.     Right lower leg: No edema.     Left lower leg: No edema.     Comments: Tenderness to palpation and increased muscle tone by the left trapezius and latissimus dorsi.  No overlying skin changes.  Skin:    General: Skin is warm and dry.  Neurological:     Mental Status: She is alert and oriented to person, place, and time. Mental status is at baseline.  Psychiatric:        Mood and Affect: Mood normal.     ED Results / Procedures / Treatments   Labs (all labs ordered are listed, but only abnormal results are displayed) Labs Reviewed  RESP PANEL BY RT-PCR (RSV, FLU A&B, COVID)  [id number]    EKG EKG Interpretation Date/Time:  Sunday September 03 2023 16:41:32 EST Ventricular Rate:  80 PR Interval:  156 QRS Duration:  80 QT Interval:  384 QTC Calculation: 442 R  Axis:   60  Text Interpretation: Normal sinus rhythm Nonspecific ST abnormality Abnormal ECG When compared with ECG of 17-Dec-2014 16:29, PREVIOUS ECG IS PRESENT Confirmed by Yolande Charleston [phone]) on 09/03/2023 6:25:52 PM  Radiology DG Chest 2 View Result Date: 09/03/2023 CLINICAL DATA:  shob EXAM: CHEST - 2 VIEW COMPARISON:  Chest x-ray 12/17/2014 FINDINGS: The heart and mediastinal contours are within normal limits. No focal consolidation. No pulmonary edema. No pleural effusion. No pneumothorax. No acute osseous abnormality. IMPRESSION: No active cardiopulmonary disease. Electronically Signed   By: Morgane  Naveau M.D.   On: 09/03/2023 16:54    Procedures Procedures    Medications Ordered in ED Medications  ketorolac  (TORADOL ) 15 MG/ML injection 15 mg (15 mg Intramuscular Given 09/03/23 2021)    ED Course/ Medical Decision Making/ A&P                                 Medical Decision Making Amount and/or Complexity of Data Reviewed Radiology: ordered.  Risk Prescription drug management.   Anna Aguilar is a 58 y.o. female with comorbidities that complicate the patient evaluation including traumatic pneumothorax who presents emergency department back pain  Initial Ddx:  Muscle strain, thoracic radiculopathy, spinal cord compression, pneumonia, pneumothorax  MDM/Course:  Patient presents to the emergency department with muscular pain in her back.  Appears to be near her scapula on the left side.  On exam does have increased tone in these areas and does have reproducible pain.  This appears to be worsened while she is at work.  Does have some shortness of breath from the pain but I suspect this is due to a pain response rather than actual pulmonary pathology.  She had an x-ray that did not show acute findings.  Also had a COVID and flu sent from triage that was negative.  Was given Toradol  and was feeling better afterwards.  Will have her follow-up with sports medicine for further  management.  This patient presents to the ED for concern of complaints listed in HPI, this involves an extensive number of treatment options, and is a complaint that carries with it a high risk of complications and morbidity. Disposition including potential need for admission considered.   Dispo: DC Home. Return precautions discussed including, but not limited to, those listed in the AVS. Allowed pt time to ask questions which were answered fully prior to dc.  Records reviewed Outpatient Clinic Notes I independently  reviewed the following imaging with scope of interpretation limited to determining acute life threatening conditions related to emergency care: Chest x-ray and agree with the radiologist interpretation with the following exceptions: none I personally reviewed and interpreted the pt's EKG: see above for interpretation  I have reviewed the patients home medications and made adjustments as needed  Portions of this note were generated with Dragon dictation software. Dictation errors may occur despite best attempts at proofreading.     Final Clinical Impression(s) / ED Diagnoses Final diagnoses:  Acute left-sided back pain, unspecified back location    Rx / DC Orders ED Discharge Orders     None         Yolande Lamar BROCKS, MD 09/03/23 2221

## 2023-09-03 NOTE — Discharge Instructions (Signed)
 You were seen for your back pain in the emergency department.   At home, please take the pain medication you are prescribed.  You may take Tylenol  and ibuprofen during the day (these are different medications and so you can alternate them).  Please also use lidocaine patches that you have been given.    Check your MyChart online for the results of any tests that had not resulted by the time you left the emergency department.   Follow-up with your primary doctor or sports medicine in 2-3 days regarding your visit.    Return immediately to the emergency department if you experience any of the following: Worsening pain, difficulty breathing, or any other concerning symptoms.    Thank you for visiting our Emergency Department. It was a pleasure taking care of you today.

## 2023-09-03 NOTE — ED Triage Notes (Signed)
 Pt c/o upper back pain x 1 month; now c/o [facility] as well  since Thurs; NAD noted in triage
